# Patient Record
Sex: Male | Born: 1995 | Race: Black or African American | Hispanic: No | Marital: Single | Smoking: Never smoker
Health system: Southern US, Community
[De-identification: ages and names within clinical notes are randomized; demographics above are authoritative.]

## PROBLEM LIST (undated history)

## (undated) DIAGNOSIS — F411 Generalized anxiety disorder: Secondary | ICD-10-CM

## (undated) DIAGNOSIS — G039 Meningitis, unspecified: Secondary | ICD-10-CM

## (undated) HISTORY — DX: Generalized anxiety disorder: F41.1

---
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
## 2016-05-07 ENCOUNTER — Encounter: Payer: Self-pay | Admitting: *Deleted

## 2016-05-07 ENCOUNTER — Telehealth: Payer: Self-pay | Admitting: *Deleted

## 2016-05-07 NOTE — Telephone Encounter (Signed)
Pre-Visit Call completed with patient and chart updated.   Pre-Visit Info documented in Specialty Comments under [employer].    

## 2016-05-10 ENCOUNTER — Other Ambulatory Visit (HOSPITAL_COMMUNITY)
Admission: RE | Admit: 2016-05-10 | Discharge: 2016-05-10 | Disposition: A | Discharge status: Home / Self Care | Payer: BLUE CROSS/BLUE SHIELD | Source: Ambulatory Visit | Attending: Medical | Admitting: Medical

## 2016-05-10 ENCOUNTER — Ambulatory Visit (INDEPENDENT_AMBULATORY_CARE_PROVIDER_SITE_OTHER): Payer: BLUE CROSS/BLUE SHIELD | Admitting: Medical

## 2016-05-10 ENCOUNTER — Encounter: Payer: Self-pay | Admitting: Medical

## 2016-05-10 VITALS — BP 108/78 | HR 98 | Temp 98.1°F | Ht 74.25 in | Wt 187.8 lb

## 2016-05-10 DIAGNOSIS — Z113 Encounter for screening for infections with a predominantly sexual mode of transmission: Secondary | ICD-10-CM | POA: Diagnosis not present

## 2016-05-10 DIAGNOSIS — Z202 Contact with and (suspected) exposure to infections with a predominantly sexual mode of transmission: Secondary | ICD-10-CM | POA: Diagnosis not present

## 2016-05-10 DIAGNOSIS — R350 Frequency of micturition: Secondary | ICD-10-CM

## 2016-05-10 LAB — POC URINALSYSI DIPSTICK (AUTOMATED)
Bilirubin, UA: NEGATIVE
Glucose, UA: NEGATIVE
Ketones, UA: NEGATIVE
LEUKOCYTES UA: NEGATIVE
NITRITE UA: NEGATIVE
PH UA: 6
PROTEIN UA: NEGATIVE
RBC UA: NEGATIVE
Spec Grav, UA: 1.02
UROBILINOGEN UA: 0.2

## 2016-05-10 NOTE — Progress Notes (Signed)
Pre visit review using our clinic review tool, if applicable. No additional management support is needed unless otherwise documented below in the visit note. 

## 2016-05-10 NOTE — Patient Instructions (Addendum)
For your frequent urination will get cmp and do urine studies.   Your school did some std type test 3 months ago but I would like repeat these to make sure.  I want to get studies back before deciding if treatment needed such as antibiotic.   You mentioned prostatitis as cause.  This is not that common in your age group. We will follow the above first before considering prostate as cause. Some antibiotic given for prostate can effect tendons and in your age group and athlete would be cautious on giving those type antibiotic.  Follow up in 7-10 days or as needed

## 2016-05-10 NOTE — Progress Notes (Signed)
Subjective:    Patient ID: Thomas Haas, male    DOB: October 15, 1996, 20 y.o.   MRN: [id number]  HPI   Pt in for first time.  I have reviewed pt PMH, PSH, FH, Social History and Surgical History.  Pt plays football  [employer]. Pt states he eats healthy. Occaisonal  caffeinated beverage.    Pt speculates some stress and concern about football at [employer].  Pt what he comes in for is increase frequency of urination past 2-3 months. His thinks it may be related to his prostate. Pt told school nurse and they worked him up. Pt had std check but it was clear. He had this check maybe 2-3 months ago.  Pt was not given any medication for his symptoms.   Pt denies being thirst all the time or hungry all the time.   He denies any dc from penis, lesion or ulcers. No testicle pain. No hx of diabetes.    Review of Systems  Constitutional: Negative for fever, chills and fatigue.  Respiratory: Negative for cough, choking, chest tightness, shortness of breath and wheezing.   Cardiovascular: Negative for chest pain and palpitations.  Genitourinary: Positive for frequency. Negative for hematuria, decreased urine volume, penile swelling, scrotal swelling, difficulty urinating and testicular pain.  Musculoskeletal: Negative for back pain and gait problem.  Hematological: Negative for adenopathy. Does not bruise/bleed easily.  Psychiatric/Behavioral: Positive for behavioral problems. Negative for confusion.    Past Medical History  Diagnosis Date  . Meningitis      Social History   Social History  . Marital Status: Single    Spouse Name: N/A  . Number of Children: N/A  . Years of Education: N/A   Occupational History  . Not on file.   Social History Main Topics  . Smoking status: Never Smoker   . Smokeless tobacco: Never Used  . Alcohol Use: No  . Drug Use: No  . Sexual Activity: Yes   Other Topics Concern  . Not on file   Social History Narrative   **  Merged History Encounter **        History reviewed. No pertinent past surgical history.  Family History  Problem Relation Age of Onset  . Cancer Other   . Diabetes Other     No Known Allergies  No current outpatient prescriptions on file prior to visit.   No current facility-administered medications on file prior to visit.    BP 108/78 mmHg  Pulse 98  Temp(Src) 98.1 F (36.7 C) (Oral)  Ht 6' 2.25" (1.886 m)  Wt 187 lb 12.8 oz (85.186 kg)  BMI 23.95 kg/m2  SpO2 97%       Objective:   Physical Exam  General- No acute distress. Pleasant patient. Neck- Full range of motion, no jvd Lungs- Clear, even and unlabored. Heart- regular rate and rhythm. Neurologic- CNII- XII grossly intact. Genital exam- normal penis. No dc. No testicle tender. No hernia in canals.       Assessment & Plan:  For your frequent urination will get cmp and do urine studies.   Your school did some std type test 3 months ago but I would like repeat these to make sure.  I want to get studies back before deciding if treatment needed such as antibiotic.   You mentioned prostatitis as cause.  This is not that common in your age group. We will follow the above first before considering prostate as cause. Some antibiotic given for  prostate can effect tendons and in your age group and athlete would be cautious on giving those type antibiotic.  Follow up in 7-10 days or as needed  Dieter Hane, Thomas Haas, [employer]-C

## 2016-05-10 NOTE — Addendum Note (Signed)
Addended by: Neldon LabellaMABE, HOLDEN S on: 05/10/2016 05:23 PM   Modules accepted: Orders

## 2016-05-11 LAB — COMPREHENSIVE METABOLIC PANEL
ALT: 16 U/L (ref 0–53)
AST: 22 U/L (ref 0–37)
Albumin: 4.6 g/dL (ref 3.5–5.2)
Alkaline Phosphatase: 50 U/L — ABNORMAL LOW (ref 52–171)
BILIRUBIN TOTAL: 0.9 mg/dL (ref 0.3–1.2)
BUN: 10 mg/dL (ref 6–23)
CALCIUM: 9.6 mg/dL (ref 8.4–10.5)
CHLORIDE: 103 meq/L (ref 96–112)
CO2: 28 meq/L (ref 19–32)
Creatinine, Ser: 1.01 mg/dL (ref 0.40–1.50)
GFR: 122.45 mL/min (ref >60.00)
GLUCOSE: 78 mg/dL (ref 70–99)
Potassium: 3.9 mEq/L (ref 3.5–5.1)
Sodium: 137 mEq/L (ref 135–145)
Total Protein: 7.1 g/dL (ref 6.0–8.3)

## 2016-05-11 LAB — HIV ANTIBODY (ROUTINE TESTING W REFLEX): HIV 1&2 Ab, 4th Generation: NONREACTIVE

## 2016-05-11 LAB — RPR

## 2016-05-12 LAB — URINE CYTOLOGY ANCILLARY ONLY
Chlamydia: NEGATIVE
Neisseria Gonorrhea: NEGATIVE
TRICH (WINDOWPATH): NEGATIVE

## 2016-05-12 MED ORDER — SULFAMETHOXAZOLE-TRIMETHOPRIM 800-160 MG PO TABS: 1.0000 | ORAL_TABLET | Freq: Two times a day (BID) | ORAL | Status: DC

## 2016-05-12 NOTE — Addendum Note (Signed)
Addended by: Neldon LabellaMABE, Ledarius Leeson S on: 05/12/2016 06:50 PM   Modules accepted: Orders, Medications

## 2016-05-18 ENCOUNTER — Telehealth: Payer: Self-pay | Admitting: Medical

## 2016-05-18 ENCOUNTER — Encounter: Payer: BLUE CROSS/BLUE SHIELD | Admitting: Medical

## 2016-05-18 NOTE — Progress Notes (Signed)
This encounter was created in error - please disregard.

## 2016-05-20 NOTE — Telephone Encounter (Signed)
Pt was no show 05/18/16 for follow up, pt has not rescheduled, 1st no show since pt just re-established, charge or no charge?

## 2016-05-20 NOTE — Telephone Encounter (Signed)
No charge. 

## 2016-05-27 ENCOUNTER — Telehealth: Payer: Self-pay | Admitting: Medical

## 2016-05-27 DIAGNOSIS — R369 Urethral discharge, unspecified: Secondary | ICD-10-CM

## 2016-05-27 DIAGNOSIS — R350 Frequency of micturition: Secondary | ICD-10-CM

## 2016-05-27 NOTE — Telephone Encounter (Signed)
°  Relationship to patient: Self Can be reached: [phone]   Reason for call: Patient called to inform provider that after taking the antibiotics he was given he is still having symptoms such as frequent urination and seeing yellowish fluids.

## 2016-05-27 NOTE — Telephone Encounter (Signed)
I decided to refer to urologst since his labs were negative and symptoms persist.

## 2016-05-27 NOTE — Telephone Encounter (Signed)
Please advise, Pt was last seen 05/10/16 and had urine ancillary studies completed and they were normal. Please advise.

## 2016-05-27 NOTE — Telephone Encounter (Signed)
Spoke with pt about his referral and he is willing to go forward with the referral this time. The pt also stated that his symptoms got better while he was on the medication and 4 days later the symptoms returned. I advised the provider of the patients concern.

## 2016-06-04 NOTE — Telephone Encounter (Signed)
Pt called stating he has the same sx and wanted to get some more meds. Advised him that results were negative and he was referred to urology. Provided pt phone # to [facility].

## 2016-07-20 ENCOUNTER — Telehealth: Payer: Self-pay | Admitting: Medical

## 2016-07-20 NOTE — Telephone Encounter (Signed)
[facility]  [address] Pt's Mother Olegario Messier  Just wanted to inform that her son is still having issue with  having some yellow semen coming out of penes ever since he started to have sex with girlfriend, pt's mother also stated that son was given antibiotic from his provider for the same issue during  last visit and that the antibiotic did helped him out. Since he was seen by urologist today but they only did a urine test and has to wait for his next appt with them. Pt  Mentioned starting school soon and would like to know if he can get rx for the issue again. Please advise.

## 2016-07-20 NOTE — Telephone Encounter (Signed)
Spoke with pt and informed him that he would need to be seen in the office since E.Saguier is out of the office and the pt has not seen another provider for urinary frequency. Pt has an appointment with Malva Cogan on 07/21/16. Pt was also advised that we would need an updated DPR so that we could speak with his mom and he voices understanding.

## 2016-07-20 NOTE — Telephone Encounter (Signed)
Relation to YO:MAYO  Call back [phone]   Reason for call:  Patient was seen by urologist today and has a few follow up questions.

## 2016-07-21 ENCOUNTER — Ambulatory Visit (INDEPENDENT_AMBULATORY_CARE_PROVIDER_SITE_OTHER): Payer: BLUE CROSS/BLUE SHIELD | Admitting: Physician Assistant

## 2016-07-21 ENCOUNTER — Encounter: Payer: Self-pay | Admitting: Physician Assistant

## 2016-07-21 VITALS — BP 126/78 | HR 77 | Temp 97.6°F | Resp 16 | Ht 74.25 in | Wt 181.5 lb

## 2016-07-21 DIAGNOSIS — R809 Proteinuria, unspecified: Secondary | ICD-10-CM | POA: Diagnosis not present

## 2016-07-21 DIAGNOSIS — F411 Generalized anxiety disorder: Secondary | ICD-10-CM

## 2016-07-21 DIAGNOSIS — K5900 Constipation, unspecified: Secondary | ICD-10-CM | POA: Diagnosis not present

## 2016-07-21 DIAGNOSIS — R350 Frequency of micturition: Secondary | ICD-10-CM | POA: Diagnosis not present

## 2016-07-21 HISTORY — DX: Generalized anxiety disorder: F41.1

## 2016-07-21 LAB — POCT URINALYSIS DIPSTICK
BILIRUBIN UA: NEGATIVE
Glucose, UA: NEGATIVE
KETONES UA: POSITIVE
LEUKOCYTES UA: NEGATIVE
Nitrite, UA: NEGATIVE
Protein, UA: POSITIVE
RBC UA: NEGATIVE
SPEC GRAV UA: 1.025
Urobilinogen, UA: 0.2
pH, UA: 6.5

## 2016-07-21 LAB — BASIC METABOLIC PANEL
BUN: 10 mg/dL (ref 6–23)
CALCIUM: 9.9 mg/dL (ref 8.4–10.5)
CO2: 26 mEq/L (ref 19–32)
CREATININE: 1.06 mg/dL (ref 0.40–1.50)
Chloride: 102 mEq/L (ref 96–112)
GFR: 115.56 mL/min (ref >60.00)
GLUCOSE: 86 mg/dL (ref 70–99)
Potassium: 3.9 mEq/L (ref 3.5–5.1)
SODIUM: 137 meq/L (ref 135–145)

## 2016-07-21 MED ORDER — ESCITALOPRAM OXALATE 10 MG PO TABS: 10.0000 mg | ORAL_TABLET | Freq: Every day | ORAL | 5 refills | Status: DC

## 2016-07-21 NOTE — Patient Instructions (Signed)
I will call you with urine results. Please stop by the lab for blood work.  Stay well hydrated and eat a well-balanced diet. Limit caffeine intake as this is a bladder stimulant.  I encourage you to increase hydration and the amount of fiber in your diet.  Start a daily probiotic (Align, Culturelle, Digestive Advantage, etc.). If no bowel movement within 24 hours, take 2 Tbs of Milk of Magnesia in a 4 oz glass of warmed prune juice every 2-3 days to help promote bowel movement. If no results within 24 hours, then repeat above regimen, adding a Dulcolax stool softener to regimen. If this does not promote a bowel movement, please call the office.  Please start the Lexapro for anxiety. Take 1/2 tablet daily for 3 days. Then increase to 1 tablet daily. Follow-up with Ramon Dredge in 1 month. Return sooner if needed.  If all labs normal and urinary symptoms are not improving, follow-up with your urologist (Dr. Berneice Heinrich).

## 2016-07-21 NOTE — Progress Notes (Signed)
Patient presents to clinic today c/o urinary frequency and urgency over the past several weeks. Has seen both his PCP and Urologist for this. Initially patient was thought to have mild prostatitis. Urine testing and STD testing negative. Patient given trial or bactrim for 7 days. Endorses taking as directed with some improvement in symptoms. Notes recurrence of symptoms since then. Denies dysuria, suprapubic pressure, fever, chills, nausea or vomiting. Denies penile pain or discharge. Is concerned that his urine STD testing was incorrect. Was set up with Urology for ongoing symptoms. Saw Dr Unknown Foley with [facility] yesterday. Notes reviewed. Patient had negative urine assessment. PVR obtained with 4 cc remaining. Patient was encouraged to hydrate well and to FU with Urology if symptoms persistent as cystoscopy was recommended.  Patient states he feels anxious regarding symptoms. Is not used to having this urge to urinate frequently. Denies change in diet. Does drink significant caffeine. Also endorses increased stress recently causing anxiety and worry. Notes multiple stressors. Stress and anxiety are impairing quality of life. Denies panic attack, depressed mood or anhedonia. Has never been treated for anxiety before. Also notes mild constipation, having hard bowel movement once every few days. Denies rectal pain or bleeding.   Past Medical History:  Diagnosis Date  . Meningitis     No current outpatient prescriptions on file prior to visit.   No current facility-administered medications on file prior to visit.     No Known Allergies  Family History  Problem Relation Age of Onset  . Cancer Other   . Diabetes Other     Social History   Social History  . Marital status: Single    Spouse name: N/A  . Number of children: N/A  . Years of education: N/A   Social History Main Topics  . Smoking status: Never Smoker  . Smokeless tobacco: Never Used  . Alcohol use No  . Drug use:  No  . Sexual activity: Yes   Other Topics Concern  . None   Social History Narrative   ** Merged History Encounter **        Review of Systems - See HPI.  All other ROS are negative.  BP 126/78 (BP Location: Left Arm, Patient Position: Sitting, Cuff Size: Normal)   Pulse 77   Temp 97.6 F (36.4 C) (Oral)   Resp 16   Ht 6' 2.25" (1.886 m)   Wt 181 lb 8 oz (82.3 kg)   SpO2 98%   BMI 23.15 kg/m   Physical Exam  Constitutional: He is oriented to person, place, and time and well-developed, well-nourished, and in no distress.  HENT:  Head: Normocephalic and atraumatic.  Eyes: Conjunctivae are normal.  Neck: Neck supple.  Cardiovascular: Normal rate, regular rhythm, normal heart sounds and intact distal pulses.   Pulmonary/Chest: Effort normal and breath sounds normal. No respiratory distress. He has no wheezes. He has no rales. He exhibits no tenderness.  Abdominal: Soft. Bowel sounds are normal. He exhibits no distension. There is no tenderness.  Neurological: He is alert and oriented to person, place, and time.  Skin: Skin is warm and dry. No rash noted.  Psychiatric: Judgment normal. His mood appears anxious. His affect is not labile. He does not exhibit a depressed mood.  Vitals reviewed.  Recent Results (from the past 2160 hour(s))  Urine cytology ancillary only     Status: None   Collection Time: 05/10/16 12:00 AM  Result Value Ref Range   Chlamydia Negative  Comment: Normal Reference Range - Negative   Neisseria gonorrhea Negative     Comment: Normal Reference Range - Negative   Trichomonas Negative     Comment: Normal Reference Range - Negative  Comprehensive metabolic panel     Status: Abnormal   Collection Time: 05/10/16  4:40 PM  Result Value Ref Range   Sodium 137 135 - 145 mEq/L   Potassium 3.9 3.5 - 5.1 mEq/L   Chloride 103 96 - 112 mEq/L   CO2 28 19 - 32 mEq/L   Glucose, Bld 78 70 - 99 mg/dL   BUN 10 6 - 23 mg/dL   Creatinine, Ser 1.61 0.40 - 1.50  mg/dL   Total Bilirubin 0.9 0.3 - 1.2 mg/dL   Alkaline Phosphatase 50 (L) 52 - 171 U/L   AST 22 0 - 37 U/L   ALT 16 0 - 53 U/L   Total Protein 7.1 6.0 - 8.3 g/dL   Albumin 4.6 3.5 - 5.2 g/dL   Calcium 9.6 8.4 - 09.6 mg/dL   GFR 045.40 >98.11 mL/min  HIV antibody     Status: None   Collection Time: 05/10/16  4:40 PM  Result Value Ref Range   HIV 1&2 Ab, 4th Generation NONREACTIVE NONREACTIVE    Comment:   HIV-1 antigen and HIV-1/HIV-2 antibodies were not detected.  There is no laboratory evidence of HIV infection.   HIV-1/2 Antibody Diff        Not indicated. HIV-1 RNA, Qual TMA          Not indicated.     PLEASE NOTE: This information has been disclosed to you from records whose confidentiality may be protected by state law. If your state requires such protection, then the state law prohibits you from making any further disclosure of the information without the specific written consent of the person to whom it pertains, or as otherwise permitted by law. A general authorization for the release of medical or other information is NOT sufficient for this purpose.   The performance of this assay has not been clinically validated in patients less than 1 years old.   For additional information please refer to http://education.[employer].com/faq/FAQ106.  (This link is being provided for informational/educational purposes only.)     RPR     Status: None   Collection Time: 05/10/16  4:40 PM  Result Value Ref Range   RPR Ser Ql NON REAC NON REAC  POCT Urinalysis Dipstick (Automated)     Status: None   Collection Time: 05/10/16  5:08 PM  Result Value Ref Range   Color, UA Yellow    Clarity, UA Clear    Glucose, UA neg    Bilirubin, UA neg    Ketones, UA neg    Spec Grav, UA 1.020    Blood, UA neg    pH, UA 6.0    Protein, UA neg    Urobilinogen, UA 0.2    Nitrite, UA neg    Leukocytes, UA Negative Negative    Assessment/Plan: 1. Urinary frequency Subacute at this  point. Previous negative Urine gc/chlamydia as well as UA and Urine culture. Urology assessment yesterday with negative workup and normal PVR. Suspect this is multifactorial -- Do not feel there is infectious etiology. Discussed anxiety and physical manifestations of stress, regarding affects on nervous system. Hoepfully treating anxiety will help. He has been dealing with constipation which can cause increased pressure on the bladder, increasing frequency and urgency. Bowel regimen reviewed. He is to decrease caffeine intake.  Repeat UA today with some proteinuria but no other abnormal findings. Will check GC/chlamydia giving concerns. Discussed if these are normal there is no need to keep checking at present. FU with Urology for cystoscopy if symptoms are not improving with interventions discussed. - POCT urinalysis dipstick - Basic metabolic panel - GC/Chlamydia Probe Amp  2. Proteinuria New. Has not been noted on recent UA with PCP or UA yesterday with [facility]. Will check BMP today.  - Basic metabolic panel  3. Anxiety state Discussed treatment options. Patient elects to start medication. Will begin Lexapro 10 mg daily. FU scheduled with PCP. - escitalopram (LEXAPRO) 10 MG tablet; Take 1 tablet (10 mg total) by mouth daily.  Dispense: 30 tablet; Refill: 5  4. Constipation, unspecified constipation type Bowel regimen reviewed with patient to help empty bowels. Suspect this will also help some with bladder pressure and urinary frequency. FU if not improving.    Piedad Climes, PA-C

## 2016-07-22 LAB — GC/CHLAMYDIA PROBE AMP
CT Probe RNA: NOT DETECTED
GC Probe RNA: NOT DETECTED

## 2016-08-11 ENCOUNTER — Telehealth: Payer: Self-pay | Admitting: Medical

## 2016-08-11 NOTE — Telephone Encounter (Signed)
Refill request for escitalopram.  Pt would like to have Rx go to : [facility]  [address], [place], [place] [id number]

## 2016-08-11 NOTE — Telephone Encounter (Signed)
I spoke with the pt and advised him that [facility] [address] and [facility] [place]. Pt voices understanding.

## 2016-08-18 ENCOUNTER — Telehealth: Payer: Self-pay | Admitting: Medical

## 2016-08-18 NOTE — Telephone Encounter (Signed)
Attempted to contact patient 8/29 and 8/30 to inform him that his appointment for 9/5 had to be rescheduled. Patients VM has not been set up and patient does not have an active My Chart account. Appointment has been cancelled.

## 2016-08-24 ENCOUNTER — Ambulatory Visit: Payer: BLUE CROSS/BLUE SHIELD | Admitting: Medical

## 2016-09-27 ENCOUNTER — Other Ambulatory Visit: Payer: Self-pay | Admitting: *Deleted

## 2016-09-27 DIAGNOSIS — F411 Generalized anxiety disorder: Secondary | ICD-10-CM

## 2016-09-27 MED ORDER — ESCITALOPRAM OXALATE 10 MG PO TABS: 10.0000 mg | ORAL_TABLET | Freq: Every day | ORAL | 1 refills | Status: AC
